# Patient Record
Sex: Female | Born: 1980 | Race: Black or African American | Hispanic: No | Marital: Single | Smoking: Never smoker
Health system: Southern US, Community
[De-identification: ages and names within clinical notes are randomized; demographics above are authoritative.]

## PROBLEM LIST (undated history)

## (undated) DIAGNOSIS — R011 Cardiac murmur, unspecified: Secondary | ICD-10-CM

## (undated) DIAGNOSIS — K219 Gastro-esophageal reflux disease without esophagitis: Secondary | ICD-10-CM

## (undated) DIAGNOSIS — K921 Melena: Secondary | ICD-10-CM

## (undated) DIAGNOSIS — Z8619 Personal history of other infectious and parasitic diseases: Secondary | ICD-10-CM

## (undated) HISTORY — DX: Melena: K92.1

## (undated) HISTORY — DX: Cardiac murmur, unspecified: R01.1

## (undated) HISTORY — DX: Gastro-esophageal reflux disease without esophagitis: K21.9

## (undated) HISTORY — DX: Personal history of other infectious and parasitic diseases: Z86.19

---

## 2013-10-18 ENCOUNTER — Ambulatory Visit: Payer: BC Managed Care – PPO | Admitting: Family Medicine

## 2013-10-18 ENCOUNTER — Ambulatory Visit: Payer: BC Managed Care – PPO

## 2013-10-18 VITALS — BP 118/64 | HR 79 | Temp 98.9°F | Resp 18 | Ht 67.0 in | Wt 157.0 lb

## 2013-10-18 DIAGNOSIS — K219 Gastro-esophageal reflux disease without esophagitis: Secondary | ICD-10-CM

## 2013-10-18 DIAGNOSIS — R079 Chest pain, unspecified: Secondary | ICD-10-CM

## 2013-10-18 LAB — POCT CBC
Granulocyte percent: 47.8 %G (ref 37–80)
HCT, POC: 43.3 % (ref 37.7–47.9)
Hemoglobin: 13.6 g/dL (ref 12.2–16.2)
Lymph, poc: 1.9 (ref 0.6–3.4)
MCH, POC: 28.4 pg (ref 27–31.2)
MCHC: 31.4 g/dL — AB (ref 31.8–35.4)
MCV: 90.4 fL (ref 80–97)
MID (cbc): 0.4 (ref 0–0.9)
MPV: 8.5 fL (ref 0–99.8)
POC Granulocyte: 2.2 (ref 2–6.9)
POC LYMPH PERCENT: 42.7 %L (ref 10–50)
POC MID %: 9.5 % (ref 0–12)
Platelet Count, POC: 274 10*3/uL (ref 142–424)
RBC: 4.79 M/uL (ref 4.04–5.48)
RDW, POC: 13.2 %
WBC: 4.5 10*3/uL — AB (ref 4.6–10.2)

## 2013-10-18 LAB — COMPLETE METABOLIC PANEL WITH GFR
ALT: 12 U/L (ref 0–35)
AST: 18 U/L (ref 0–37)
Albumin: 4.7 g/dL (ref 3.5–5.2)
Alkaline Phosphatase: 56 U/L (ref 39–117)
BUN: 11 mg/dL (ref 6–23)
CO2: 26 mEq/L (ref 19–32)
Calcium: 10.5 mg/dL (ref 8.4–10.5)
Chloride: 103 mEq/L (ref 96–112)
Creat: 0.81 mg/dL (ref 0.50–1.10)
GFR, Est African American: >89 mL/min
GFR, Est Non African American: >89 mL/min
Glucose, Bld: 94 mg/dL (ref 70–99)
Potassium: 4.8 mEq/L (ref 3.5–5.3)
Sodium: 137 mEq/L (ref 135–145)
Total Bilirubin: 0.6 mg/dL (ref 0.3–1.2)
Total Protein: 7.6 g/dL (ref 6.0–8.3)

## 2013-10-18 NOTE — Progress Notes (Signed)
Chief Complaint:  Chief Complaint  Patient presents with  . Chest Pain    x 1.5 weeks ; worse at night , hurts to breath in     HPI: Tanya Ayala is a 33 y.o. female who is here for mid substernum CP and radiates to back for the last 1.5 weeks, NKI, no triggers, last night she hurts it the most. She does have a history of heartburn. , When it comes on it will last all day, throbbing and dull, contracting pain, 8/10 lasts for 1-2 days. 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 Deneis any heart diseasein family but mom had a pacemaker put in for SSS. Deneis HTN, hyperlipidemia, diabetes. HAs been eating fastfood and TV dinners.Nonsmoker, No unintentional weightloss.  Like to drink orange juice  History reviewed. No pertinent past medical history. History reviewed. No pertinent past surgical history. History   Social History  . Marital Status: Single    Spouse Name: N/A    Number of Children: N/A  . Years of Education: N/A   Social History Main Topics  . Smoking status: Never Smoker   . Smokeless tobacco: None  . Alcohol Use: Yes  . Drug Use: No  . Sexual Activity: None   Other Topics Concern  . None   Social History Narrative  . None   Family History  Problem Relation Age of Onset  . Hyperlipidemia Mother   . Cancer Father   . Stroke Maternal Grandmother    No Known Allergies Prior to Admission medications   Medication Sig Start Date End Date Taking? Authorizing Provider  norethindrone-ethinyl estradiol (JUNEL FE,GILDESS FE,LOESTRIN FE) 1-20 MG-MCG tablet Take 1 tablet by mouth daily.   Yes Historical Provider, MD     ROS: The patient denies fevers, chills, night sweats, unintentional weight loss,  wheezing, dyspnea on exertion, nausea, vomiting, abdominal pain, dysuria, hematuria, melena, numbness, weakness, or tingling.  All other systems have been reviewed and were  otherwise negative with the exception of those mentioned in the HPI and as above.    PHYSICAL EXAM: Filed Vitals:   10/18/13 0818  BP: 118/64  Pulse: 79  Temp: 98.9 F (37.2 C)  Resp: 18   Filed Vitals:   10/18/13 0818  Height: 5\' 7"  (1.702 m)  Weight: 157 lb (71.215 kg)   Body mass index is 24.58 kg/(m^2).  General: Alert, no acute distress HEENT:  Normocephalic, atraumatic, oropharynx patent. EOMI, PERRLA Cardiovascular:  Regular rate and rhythm, no rubs murmurs or gallops.  No Carotid bruits, radial pulse intact. No pedal edema.  Respiratory: Clear to auscultation bilaterally.  No wheezes, rales, or rhonchi.  No cyanosis, no use of accessory musculature GI: No organomegaly, abdomen is soft and non-tender, positive bowel sounds.  No masses. Skin: No rashes. Neurologic: Facial musculature symmetric. Psychiatric: Patient is appropriate throughout our interaction. Lymphatic: No cervical lymphadenopathy Musculoskeletal: Gait intact.   LABS: Results for orders placed in visit on 10/18/13  POCT CBC      Result Value Range   WBC 4.5 (*) 4.6 - 10.2 K/uL   Lymph, poc 1.9  0.6 - 3.4   POC LYMPH PERCENT 42.7  10 - 50 %L   MID (cbc) 0.4  0 - 0.9   POC MID % 9.5  0 - 12 %M   POC Granulocyte 2.2  2 - 6.9   Granulocyte percent 47.8  37 -  80 %G   RBC 4.79  4.04 - 5.48 M/uL   Hemoglobin 13.6  12.2 - 16.2 g/dL   HCT, POC 40.943.3  81.137.7 - 47.9 %   MCV 90.4  80 - 97 fL   MCH, POC 28.4  27 - 31.2 pg   MCHC 31.4 (*) 31.8 - 35.4 g/dL   RDW, POC 91.413.2     Platelet Count, POC 274  142 - 424 K/uL   MPV 8.5  0 - 99.8 fL     EKG/XRAY:   [facility]. No acute cardiopulmonary process EKG SR 66 bpm nonspecific ST changes, no depression or elevation   ASSESSMENT/PLAN: Encounter Diagnoses  Name Primary?  . Chest pain   . GERD (gastroesophageal reflux disease) Yes   EKG, Chest Xray and labs in office are normal Will await for CMP She was given nexium  sampels, she was given GI cocktail  F/u prn or ER for worsening sxs GERD precautions given  Gross sideeffects, risk and benefits, and alternatives of medications d/w patient. Patient is aware that all medications have potential sideeffects and we are unable to predict every sideeffect or drug-drug interaction that may occur.  Lizzeth Meder PHUONG, DO 10/18/2013 9:48 AM

## 2013-10-18 NOTE — Patient Instructions (Signed)

## 2013-11-13 ENCOUNTER — Ambulatory Visit: Payer: BC Managed Care – PPO | Admitting: Emergency Medicine

## 2013-11-13 VITALS — BP 100/68 | HR 95 | Temp 98.3°F | Resp 18 | Ht 66.0 in | Wt 153.0 lb

## 2013-11-13 DIAGNOSIS — K219 Gastro-esophageal reflux disease without esophagitis: Secondary | ICD-10-CM

## 2013-11-13 MED ORDER — ESOMEPRAZOLE MAGNESIUM 40 MG PO CPDR: 40.0000 mg | DELAYED_RELEASE_CAPSULE | Freq: Every day | ORAL | Status: DC

## 2013-11-13 MED ORDER — SUCRALFATE 1 G PO TABS: ORAL_TABLET | ORAL | Status: DC

## 2013-11-13 NOTE — Progress Notes (Signed)
[facility] [address], [place] [address] [phone]  Date:  11/13/2013   Name:  Tanya Ayala   DOB:  September 25, 1980   MRN:  [id number]  PCP:  No PCP Per Patient    Chief Complaint: Follow-up and clogged rt ear x1 week   History of Present Illness:  Tanya Ayala is a 33 y.o. very pleasant female patient who presents with the following:  Seen with GERD and has not improved with medications.  Frequent sodas, occasional alcohol and daily caffeine.  Long history of waterbrash and heartburn.  Never really evaluated.  Non smoker.  No improvement with over the counter medications or other home remedies. Denies other complaint or health concern today.   There are no active problems to display for this patient.   History reviewed. No pertinent past medical history.  History reviewed. No pertinent past surgical history.  History  Substance Use Topics  . Smoking status: Never Smoker   . Smokeless tobacco: Not on file  . Alcohol Use: Yes    Family History  Problem Relation Age of Onset  . Hyperlipidemia Mother   . Cancer Father   . Stroke Maternal Grandmother     No Known Allergies  Medication list has been reviewed and updated.  Current Outpatient Prescriptions on File Prior to Visit  Medication Sig Dispense Refill  . norethindrone-ethinyl estradiol (JUNEL FE,GILDESS FE,LOESTRIN FE) 1-20 MG-MCG tablet Take 1 tablet by mouth daily.       No current facility-administered medications on file prior to visit.    Review of Systems:  As per HPI, otherwise negative.    Physical Examination: Filed Vitals:   11/13/13 1300  BP: 100/68  Pulse: 95  Temp: 98.3 F (36.8 C)  Resp: 18   Filed Vitals:   11/13/13 1300  Height: 5\' 6"  (1.676 m)  Weight: 153 lb (69.4 kg)   Body mass index is 24.71 kg/(m^2). Ideal Body Weight: Weight in (lb) to have BMI = 25: 154.6  GEN: WDWN, NAD, Non-toxic, A & O x 3 HEENT: Atraumatic, Normocephalic.  Neck supple. No masses, No LAD. Ears and Nose: No external deformity. CV: RRR, No M/G/R. No JVD. No thrill. No extra heart sounds. PULM: CTA B, no wheezes, crackles, rhonchi. No retractions. No resp. distress. No accessory muscle use. ABD: S, NT, ND, +BS. No rebound. No HSM. EXTR: No c/c/e NEURO Normal gait.  PSYCH: Normally interactive. Conversant. Not depressed or anxious appearing.  Calm demeanor.    Assessment and Plan: GERD nexium  carafate Avoid evening eating and drinking after supper Follow up in a month   Signed,  Phillips OdorJeffery Anderson, MD

## 2013-11-13 NOTE — Patient Instructions (Signed)
Gastroesophageal Reflux Disease, Adult  Gastroesophageal reflux disease (GERD) happens when acid from your stomach flows up into the esophagus. When acid comes in contact with the esophagus, the acid causes soreness (inflammation) in the esophagus. Over time, GERD may create small holes (ulcers) in the lining of the esophagus.  CAUSES   · Increased body weight. This puts pressure on the stomach, making acid rise from the stomach into the esophagus.  · Smoking. This increases acid production in the stomach.  · Drinking alcohol. This causes decreased pressure in the lower esophageal sphincter (valve or ring of muscle between the esophagus and stomach), allowing acid from the stomach into the esophagus.  · Late evening meals and a full stomach. This increases pressure and acid production in the stomach.  · A malformed lower esophageal sphincter.  Sometimes, no cause is found.  SYMPTOMS   · Burning pain in the lower part of the mid-chest behind the breastbone and in the mid-stomach area. This may occur twice a week or more often.  · Trouble swallowing.  · Sore throat.  · Dry cough.  · Asthma-like symptoms including chest tightness, shortness of breath, or wheezing.  DIAGNOSIS   Your caregiver may be able to diagnose GERD based on your symptoms. In some cases, X-rays and other tests may be done to check for complications or to check the condition of your stomach and esophagus.  TREATMENT   Your caregiver may recommend over-the-counter or prescription medicines to help decrease acid production. Ask your caregiver before starting or adding any new medicines.   HOME CARE INSTRUCTIONS   · Change the factors that you can control. Ask your caregiver for guidance concerning weight loss, quitting smoking, and alcohol consumption.  · Avoid foods and drinks that make your symptoms worse, such as:  · Caffeine or alcoholic drinks.  · Chocolate.  · Peppermint or mint flavorings.  · Garlic and onions.  · Spicy foods.  · Citrus fruits,  such as oranges, lemons, or limes.  · Tomato-based foods such as sauce, chili, salsa, and pizza.  · Fried and fatty foods.  · Avoid lying down for the 3 hours prior to your bedtime or prior to taking a nap.  · Eat small, frequent meals instead of large meals.  · Wear loose-fitting clothing. Do not wear anything tight around your waist that causes pressure on your stomach.  · Raise the head of your bed 6 to 8 inches with wood blocks to help you sleep. Extra pillows will not help.  · Only take over-the-counter or prescription medicines for pain, discomfort, or fever as directed by your caregiver.  · Do not take aspirin, ibuprofen, or other nonsteroidal anti-inflammatory drugs (NSAIDs).  SEEK IMMEDIATE MEDICAL CARE IF:   · You have pain in your arms, neck, jaw, teeth, or back.  · Your pain increases or changes in intensity or duration.  · You develop nausea, vomiting, or sweating (diaphoresis).  · You develop shortness of breath, or you faint.  · Your vomit is green, yellow, black, or looks like coffee grounds or blood.  · Your stool is red, bloody, or black.  These symptoms could be signs of other problems, such as heart disease, gastric bleeding, or esophageal bleeding.  MAKE SURE YOU:   · Understand these instructions.  · Will watch your condition.  · Will get help right away if you are not doing well or get worse.  Document Released: 06/19/2005 Document Revised: 12/02/2011 Document Reviewed: 03/29/2011  ExitCare® Patient   Information ©2014 [employer].  Diet for Gastroesophageal Reflux Disease, Adult  Reflux (acid reflux) is when acid from your stomach flows up into the esophagus. When acid comes in contact with the esophagus, the acid causes irritation and soreness (inflammation) in the esophagus. When reflux happens often or so severely that it causes damage to the esophagus, it is called gastroesophageal reflux disease (GERD). Nutrition therapy can help ease the discomfort of GERD.  FOODS OR DRINKS TO AVOID OR  LIMIT  · Smoking or chewing tobacco. Nicotine is one of the most potent stimulants to acid production in the gastrointestinal tract.  · Caffeinated and decaffeinated coffee and black tea.  · Regular or low-calorie carbonated beverages or energy drinks (caffeine-free carbonated beverages are allowed).    · Strong spices, such as black pepper, white pepper, red pepper, cayenne, curry powder, and chili powder.  · Peppermint or spearmint.  · Chocolate.  · High-fat foods, including meats and fried foods. Extra added fats including oils, butter, salad dressings, and nuts. Limit these to less than 8 tsp per day.  · Fruits and vegetables if they are not tolerated, such as citrus fruits or tomatoes.  · Alcohol.  · Any food that seems to aggravate your condition.  If you have questions regarding your diet, call your caregiver or a registered dietitian.  OTHER THINGS THAT MAY HELP GERD INCLUDE:   · Eating your meals slowly, in a relaxed setting.  · Eating 5 to 6 small meals per day instead of 3 large meals.  · Eliminating food for a period of time if it causes distress.  · Not lying down until 3 hours after eating a meal.  · Keeping the head of your bed raised 6 to 9 inches (15 to 23 cm) by using a foam wedge or blocks under the legs of the bed. Lying flat may make symptoms worse.  · Being physically active. Weight loss may be helpful in reducing reflux in overweight or obese adults.  · Wear loose fitting clothing  EXAMPLE MEAL PLAN  This meal plan is approximately 2,000 calories based on ChooseMyPlate.gov meal planning guidelines.  Breakfast  · ½ cup cooked oatmeal.  · 1 cup strawberries.  · 1 cup low-fat milk.  · 1 oz almonds.  Snack  · 1 cup cucumber slices.  · 6 oz yogurt (made from low-fat or fat-free milk).  Lunch  · 2 slice whole-wheat bread.  · 2½ oz sliced turkey.  · 2 tsp mayonnaise.  · 1 cup blueberries.  · 1 cup snap peas.  Snack  · 6 whole-wheat crackers.  · 1 oz string cheese.  Dinner  · ½ cup brown rice.  · 1  cup mixed veggies.  · 1 tsp olive oil.  · 3 oz grilled fish.  Document Released: 09/09/2005 Document Revised: 12/02/2011 Document Reviewed: 07/26/2011  ExitCare® Patient Information ©2014 [employer].

## 2013-11-15 LAB — H. PYLORI ANTIBODY, IGG: H Pylori IgG: 0.49 {ISR}

## 2014-12-13 ENCOUNTER — Encounter: Payer: Self-pay | Admitting: Family Medicine

## 2014-12-13 ENCOUNTER — Ambulatory Visit (INDEPENDENT_AMBULATORY_CARE_PROVIDER_SITE_OTHER): Payer: BLUE CROSS/BLUE SHIELD | Admitting: Family Medicine

## 2014-12-13 VITALS — BP 108/82 | HR 90 | Temp 98.9°F | Ht 65.5 in | Wt 150.7 lb

## 2014-12-13 DIAGNOSIS — Z23 Encounter for immunization: Secondary | ICD-10-CM | POA: Diagnosis not present

## 2014-12-13 DIAGNOSIS — K219 Gastro-esophageal reflux disease without esophagitis: Secondary | ICD-10-CM

## 2014-12-13 DIAGNOSIS — N946 Dysmenorrhea, unspecified: Secondary | ICD-10-CM

## 2014-12-13 DIAGNOSIS — Z7689 Persons encountering health services in other specified circumstances: Secondary | ICD-10-CM

## 2014-12-13 DIAGNOSIS — Z7189 Other specified counseling: Secondary | ICD-10-CM | POA: Diagnosis not present

## 2014-12-13 MED ORDER — NORETHIN ACE-ETH ESTRAD-FE 1.5-30 MG-MCG PO TABS: 1.0000 | ORAL_TABLET | Freq: Every day | ORAL | Status: DC

## 2014-12-13 NOTE — Progress Notes (Signed)
Pre visit review using our clinic review tool, if applicable. No additional management support is needed unless otherwise documented below in the visit note. 

## 2014-12-13 NOTE — Progress Notes (Signed)
HPI:  Tanya Ayala is here to establish care. Referred by two of her friends. Last PCP and physical: had a pap last year and this was normal. Reports all normal paps the last 8 years.  Has the following chronic problems that require follow up and concerns today:  Contraception: -takes microgestin -she reports gets HAs and nausea with her periods, no aura -periods are regular -getting married next august, sexually active -not concerned fo STI  GERD: -doing ok as long as watches her diet -on PPI in the past -denies: vomiting, dysphagia, weight loss  ROS negative for unless reported above: fevers, unintentional weight loss, hearing or vision loss, chest pain, palpitations, struggling to breath, hemoptysis, melena, hematochezia, hematuria, falls, loc, si, thoughts of self harm  Past Medical History  Diagnosis Date  . GERD (gastroesophageal reflux disease)   . History of chicken pox     History reviewed. No pertinent past surgical history.  Family History  Problem Relation Age of Onset  . Hyperlipidemia Mother   . Cancer Father     lung-deceased  . Stroke Maternal Grandmother     History   Social History  . Marital Status: Single    Spouse Name: N/A  . Number of Children: N/A  . Years of Education: N/A   Social History Main Topics  . Smoking status: Never Smoker   . Smokeless tobacco: Not on file  . Alcohol Use: Yes     Comment: occ rare alcohol  . Drug Use: No  . Sexual Activity: Not on file   Other Topics Concern  . 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  norethindrone-ethinyl estradiol-iron (MICROGESTIN FE,GILDESS FE,LOESTRIN FE) 1.5-30 MG-MCG tablet, Take 1 tablet by mouth daily., Disp: 1 Package, Rfl:  11  EXAM:  Filed Vitals:   12/13/14 0929  BP: 108/82  Pulse: 90  Temp: 98.9 F (37.2 C)    Body mass index is 24.69 kg/(m^2).  GENERAL: vitals reviewed and listed above, alert, oriented, appears well hydrated and in no acute distress  HEENT: atraumatic, conjunttiva clear, no obvious abnormalities on inspection of external nose and ears  NECK: no obvious masses on inspection  LUNGS: clear to auscultation bilaterally, no wheezes, rales or rhonchi, good air movement  CV: HRRR, no peripheral edema  MS: moves all extremities without noticeable abnormality  PSYCH: pleasant and cooperative, no obvious depression or anxiety  ASSESSMENT AND PLAN:  Discussed the following assessment and plan:  Dysmenorrhea -trial naproxen  Gastroesophageal reflux disease, esophagitis presence not specified -cont lifestyle changes, tums or zantac prn  Encounter to establish care -We reviewed the PMH, PSH, FH, SH, Meds and Allergies. -We provided refills for any medications we will prescribe as needed. -We addressed current concerns per orders and patient instructions. -We have asked for records for pertinent exams, studies, vaccines and notes from previous providers. -We have advised patient to follow up per instructions below.   -Patient advised to return or notify a doctor immediately if symptoms worsen or persist or new concerns arise.  Patient Instructions  BEFORE YOU: -schedule physical in 3-6 months  Try Naproxen (aleve) twice daily for 2 days before  and during period  We recommend the following healthy lifestyle measures: - eat a healthy diet consisting of lots of vegetables, fruits, beans, nuts, seeds, healthy meats such as white chicken and fish and whole grains.  - avoid fried foods, fast food, processed foods, sodas, red meet and other fattening foods.  - get a least 150 minutes of aerobic exercise per week.       Tanya BasqueKIM, Tanya Ayala R.

## 2014-12-13 NOTE — Addendum Note (Signed)
Addended by: Johnella MoloneyFUNDERBURK, JO A on: 12/13/2014 10:26 AM   Modules accepted: Orders

## 2014-12-13 NOTE — Patient Instructions (Signed)
BEFORE YOU: -schedule physical in 3-6 months  Try Naproxen (aleve) twice daily for 2 days before and during period  We recommend the following healthy lifestyle measures: - eat a healthy diet consisting of lots of vegetables, fruits, beans, nuts, seeds, healthy meats such as white chicken and fish and whole grains.  - avoid fried foods, fast food, processed foods, sodas, red meet and other fattening foods.  - get a least 150 minutes of aerobic exercise per week.

## 2015-02-27 IMAGING — CR DG CHEST 2V
2 series · 2 of 2 positions shown · non-contrast
Comparison: None.

CLINICAL DATA: Chest pain.

EXAM:
CHEST  2 VIEW

[PA]
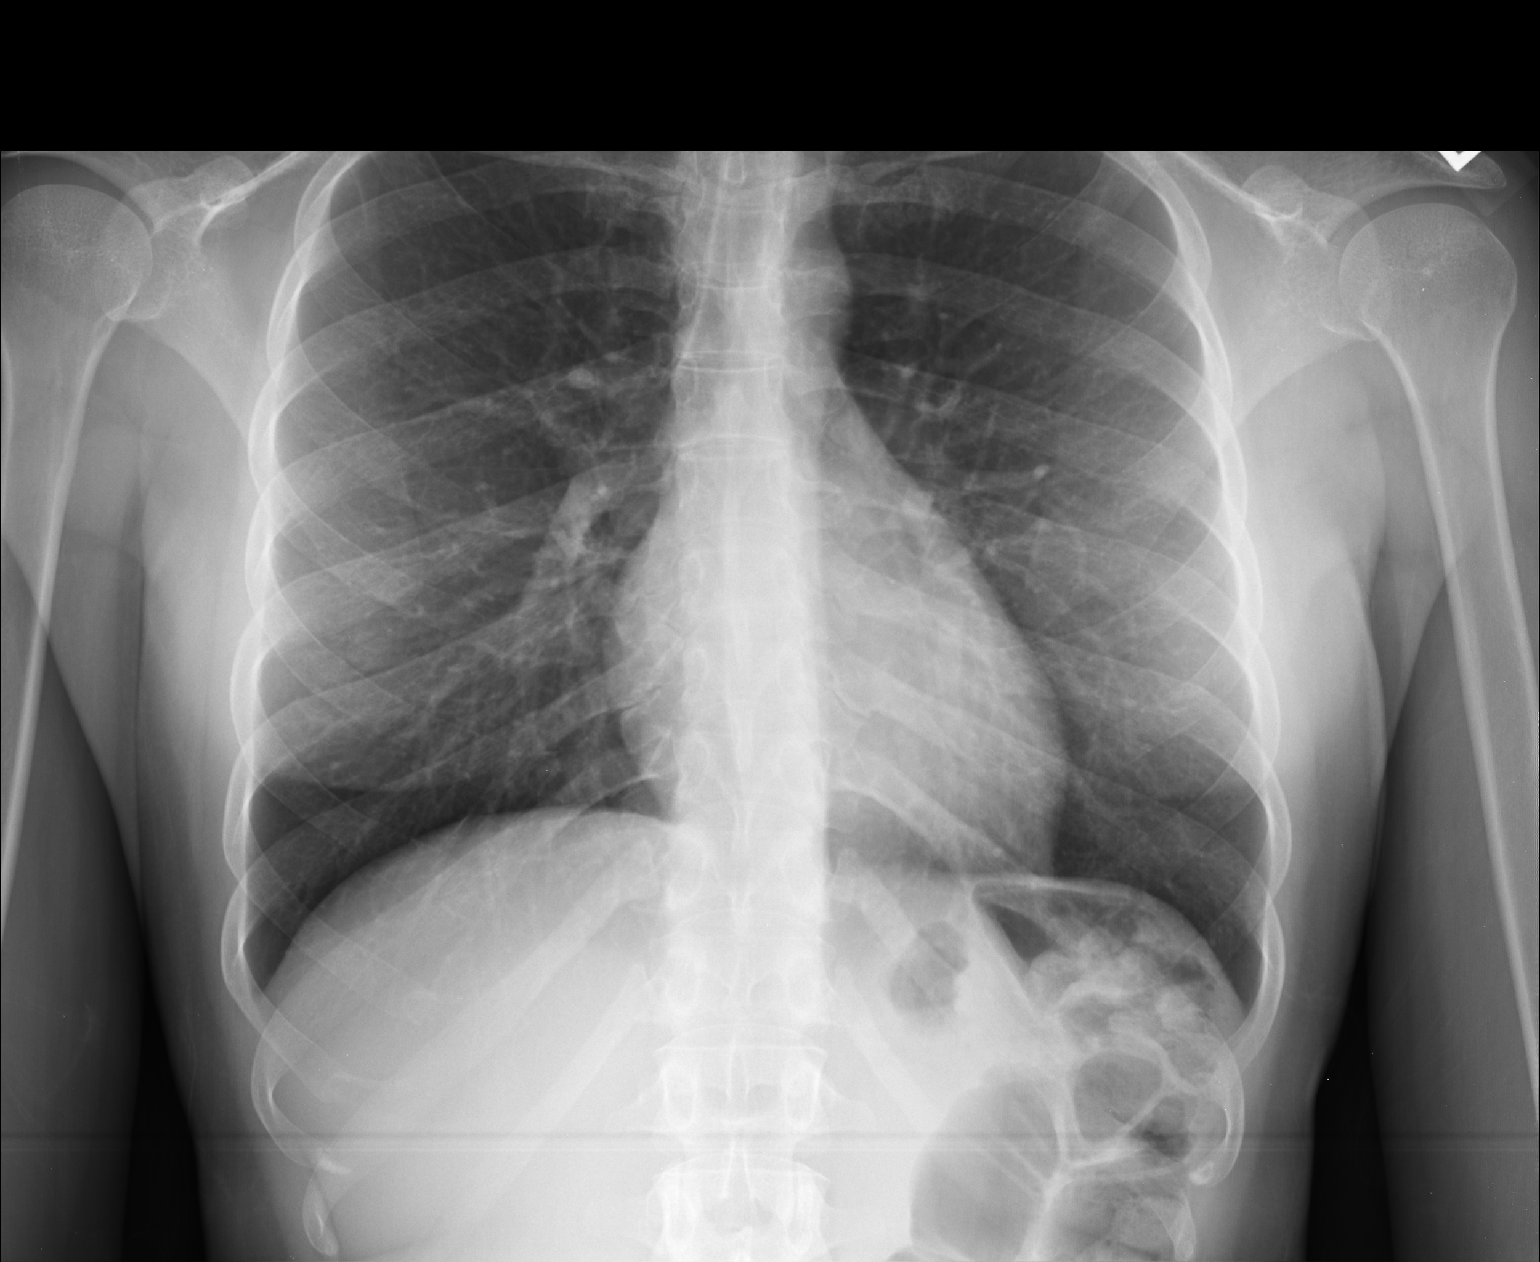

[lateral]
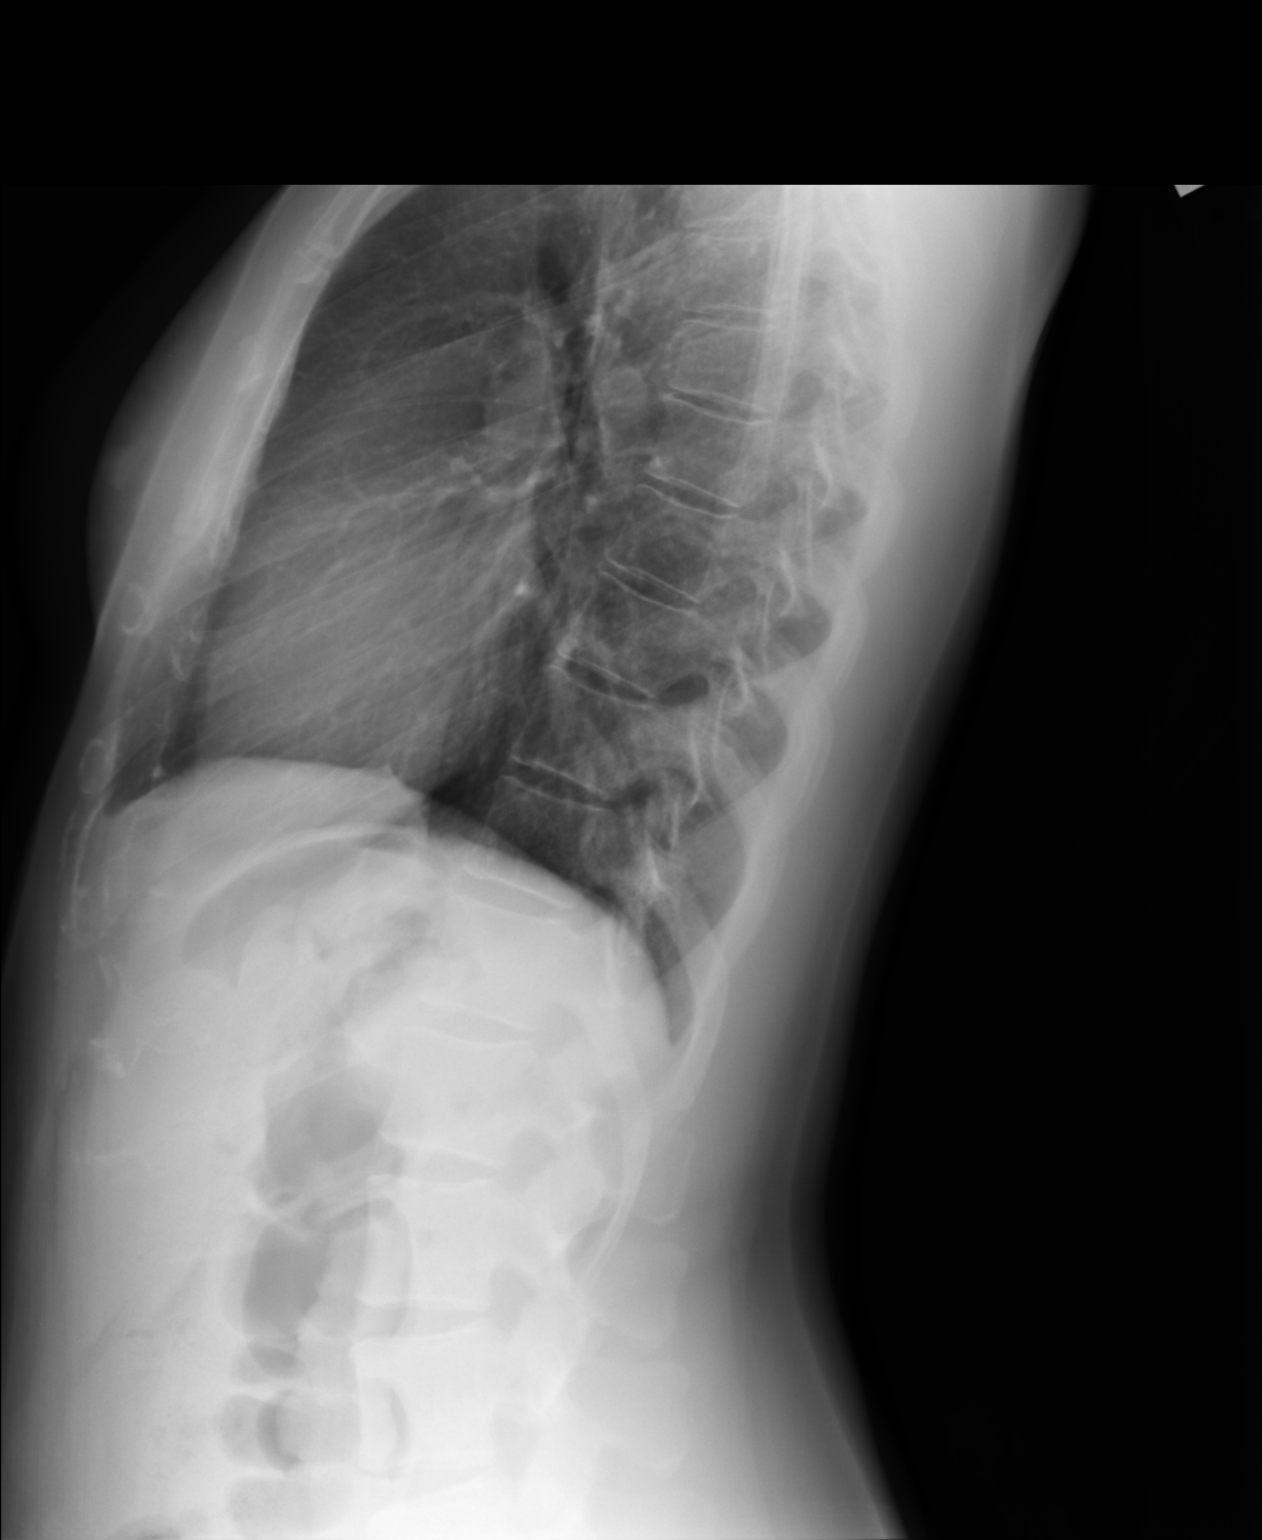

[2 of 2 positions shown; findings below may reference images not displayed]

FINDINGS: The heart size and mediastinal contours are within normal limits.
Both lungs are clear. No pleural effusion or pneumothorax is noted.
The visualized skeletal structures are unremarkable.
IMPRESSION: No active cardiopulmonary disease.

## 2015-03-23 ENCOUNTER — Encounter: Payer: Self-pay | Admitting: Family Medicine

## 2015-03-23 ENCOUNTER — Ambulatory Visit (INDEPENDENT_AMBULATORY_CARE_PROVIDER_SITE_OTHER): Payer: BLUE CROSS/BLUE SHIELD | Admitting: Family Medicine

## 2015-03-23 VITALS — BP 98/62 | HR 87 | Temp 98.5°F | Ht 65.0 in | Wt 149.0 lb

## 2015-03-23 DIAGNOSIS — Z0000 Encounter for general adult medical examination without abnormal findings: Secondary | ICD-10-CM | POA: Diagnosis not present

## 2015-03-23 DIAGNOSIS — Z3049 Encounter for surveillance of other contraceptives: Secondary | ICD-10-CM

## 2015-03-23 DIAGNOSIS — Z3200 Encounter for pregnancy test, result unknown: Secondary | ICD-10-CM | POA: Diagnosis not present

## 2015-03-23 LAB — LIPID PANEL
CHOLESTEROL: 163 mg/dL (ref 0–200)
HDL: 52.9 mg/dL (ref >39.00)
LDL Cholesterol: 96 mg/dL (ref 0–99)
NonHDL: 110.1
TRIGLYCERIDES: 71 mg/dL (ref 0.0–149.0)
Total CHOL/HDL Ratio: 3
VLDL: 14.2 mg/dL (ref 0.0–40.0)

## 2015-03-23 LAB — POCT URINE PREGNANCY: PREG TEST UR: NEGATIVE

## 2015-03-23 LAB — HEMOGLOBIN A1C: HEMOGLOBIN A1C: 5.5 % (ref 4.6–6.5)

## 2015-03-23 MED ORDER — ETONOGESTREL-ETHINYL ESTRADIOL 0.12-0.015 MG/24HR VA RING: VAGINAL_RING | VAGINAL | Status: AC

## 2015-03-23 NOTE — Patient Instructions (Signed)
BEFORE YOU LEAVE: -labs -obtain prior pap results -follow up yearly and as needed  We recommend the following healthy lifestyle measures: - eat a healthy diet consisting of lots of vegetables, fruits, beans, nuts, seeds, healthy meats such as white chicken and fish and whole grains.  - avoid fried foods, fast food, processed foods, sodas, red meet and other fattening foods.  - get a least 150 minutes of aerobic exercise per week.   -We have ordered labs or studies at this visit. It can take up to 1-2 weeks for results and processing. We will contact you with instructions IF your results are abnormal. Normal results will be released to your [facility]. If you have not heard from us or can not find your results in [facility] in 2 weeks please contact our office.

## 2015-03-23 NOTE — Progress Notes (Signed)
HPI:  Here for CPE:  -Concerns and/or follow up today:   Contraception: -she wants to try something different -got headaches and nausea with periods with all pills -denies HTN, hx blood clots -did not try nsaids -FDLMP: June 26th, 2016, regular normal  -Diet: variety of foods, balance and well rounded, larger portion sizes  -Exercise: no regular exercise  -Taking folic acid, vitamin D or calcium: MV  -Diabetes and Dyslipidemia Screening: not FASTING  -Hx of HTN: no  -Vaccines: UTD  -pap history: 03/2014, reports normal, reports all normal paps in the last 10 years, [facility], [facility]  -sexual activity: yes, female partner, no new partners  -wants STI testing (Hep C if born [birth year]): no  -FH breast, colon or ovarian ca: see FH Last mammogram: n/a Last colon cancer screening: n/a  -Alcohol, Tobacco, drug use: see social history  Review of Systems - no fevers, unintentional weight loss, vision loss, hearing loss, chest pain, sob, hemoptysis, melena, hematochezia, hematuria, genital discharge, changing or concerning skin lesions, bleeding, bruising, loc, thoughts of self harm or SI  Past Medical History  Diagnosis Date  . GERD (gastroesophageal reflux disease)   . History of chicken pox   . Blood in stool   . Heart murmur     No past surgical history on file.  Family History  Problem Relation Age of Onset  . Hyperlipidemia Mother   . Cancer Father     lung-deceased  . Stroke Maternal Grandmother     History   Social History  . Marital Status: Single    Spouse Name: N/A  . Number of Children: N/A  . Years of Education: N/A   Social History Main Topics  . Smoking status: Never Smoker   . Smokeless tobacco: Not on file  . Alcohol Use: Yes     Comment: occ rare alcohol  . Drug Use: No  . Sexual Activity: Not on file   Other Topics Concern  . 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  etonogestrel-ethinyl estradiol (NUVARING) 0.12-0.015 MG/24HR vaginal ring, Insert vaginally and leave in place for 3 consecutive weeks, then remove for 1 week., Disp: 3 each, Rfl: 3  EXAM:  Filed Vitals:   03/23/15 0758  BP: 98/62  Pulse: 87  Temp: 98.5 F (36.9 C)    GENERAL: vitals reviewed and listed below, alert, oriented, appears well hydrated and in no acute distress  HEENT: head atraumatic, PERRLA, normal appearance of eyes, ears, nose and mouth. moist mucus membranes.  NECK: supple, no masses or lymphadenopathy  LUNGS: clear to auscultation bilaterally, no rales, rhonchi or wheeze  CV: HRRR, no peripheral edema or cyanosis, normal pedal pulses  BREAST: declined  ABDOMEN: bowel sounds normal, soft, non tender to palpation, no masses, no rebound or guarding  GU: declined  RECTAL: refused  SKIN: no rash or abnormal lesions  MS: normal gait, moves all extremities normally  NEURO: CN II-XII grossly intact, normal muscle strength and sensation to light touch on extremities  PSYCH: normal affect, pleasant and cooperative  ASSESSMENT AND PLAN:  Discussed the following assessment and plan:  Visit for preventive health examination - Plan: Lipid Panel, Hemoglobin A1c, etonogestrel-ethinyl estradiol (NUVARING) 0.12-0.015 MG/24HR vaginal ring  Pregnancy examination or test, pregnancy unconfirmed - Plan: POCT urine pregnancy, etonogestrel-ethinyl estradiol (NUVARING) 0.12-0.015 MG/24HR vaginal ring  Encounter for surveillance of other contraceptive  -Discussed and advised all [place] preventive services health task force level A and B recommendations for age, sex and risks.  -Advised at least 150 minutes of exercise per week and a healthy diet low in  saturated fats and sweets and consisting of fresh fruits and vegetables, lean meats such as fish and white chicken and whole grains.  -will advise assistant to obtain pap done in the last 1 year  -discussed options for birth control - she wants to try the nuva ring, discussed risks, neg preg test today, may consider paragard if still has side effects  -NON-FASTING labs, studies and vaccines per orders this encounter  Orders Placed This Encounter  Procedures  . Lipid Panel  . Hemoglobin A1c  . POCT urine pregnancy    Patient advised to return to clinic immediately if symptoms worsen or persist or new concerns.  Patient Instructions  BEFORE YOU LEAVE: -labs -obtain prior pap results -follow up yearly and as needed  We recommend the following healthy lifestyle measures: - eat a healthy diet consisting of lots of vegetables, fruits, beans, nuts, seeds, healthy meats such as white chicken and fish and whole grains.  - avoid fried foods, fast food, processed foods, sodas, red meet and other fattening foods.  - get a least 150 minutes of aerobic exercise per week.   -We have ordered labs or studies at this visit. It can take up to 1-2 weeks for results and processing. We will contact you with instructions IF your results are abnormal. Normal results will be released to [facility]. If you have not heard from [place] or can not find your results in [facility] in 2 weeks please contact our office.           No Follow-up on file.  [name], HANNAH R.

## 2015-03-23 NOTE — Progress Notes (Signed)
Pre visit review using our clinic review tool, if applicable. No additional management support is needed unless otherwise documented below in the visit note. 

## 2015-04-14 ENCOUNTER — Telehealth: Payer: Self-pay | Admitting: *Deleted

## 2015-04-14 NOTE — Telephone Encounter (Signed)
Patient called wanting to know when to insert the Nuvaring.  I advised the pt per Dr Selena Batten she should insert this the Sunday after her period starts, change every 3 weeks and she agreed.

## 2015-05-04 ENCOUNTER — Ambulatory Visit (INDEPENDENT_AMBULATORY_CARE_PROVIDER_SITE_OTHER): Payer: BLUE CROSS/BLUE SHIELD | Admitting: Family Medicine

## 2015-05-04 ENCOUNTER — Encounter: Payer: Self-pay | Admitting: Family Medicine

## 2015-05-04 VITALS — BP 132/78 | HR 81 | Temp 99.1°F | Ht 65.0 in | Wt 148.0 lb

## 2015-05-04 DIAGNOSIS — N63 Unspecified lump in unspecified breast: Secondary | ICD-10-CM

## 2015-05-04 NOTE — Progress Notes (Signed)
  HPI:  ? Breast lump: -thought she fell a small lump in her L breast 1 week ago, she has ha trouble feeling it since then - can feel it sometime -denies discharge, skin changes, pain -FDLMP: July 24th -no FH breast or ovarian cancer  ROS: See pertinent positives and negatives per HPI.  Past Medical History  Diagnosis Date  . GERD (gastroesophageal reflux disease)   . History of chicken pox   . Blood in stool   . Heart murmur     No past surgical history on file.  Family History  Problem Relation Age of Onset  . Hyperlipidemia Mother   . Cancer Father     lung-deceased  . Stroke Maternal Grandmother     Social History   Social History  . Marital Status: Single    Spouse Name: N/A  . Number of Children: N/A  . Years of Education: N/A   Social History Main Topics  . Smoking status: Never Smoker   . Smokeless tobacco: None  . Alcohol Use: Yes     Comment: occ rare alcohol  . Drug Use: No  . Sexual Activity: Not Asked   Other Topics Concern  . 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  etonogestrel-ethinyl estradiol (NUVARING) 0.12-0.015 MG/24HR vaginal ring, Insert vaginally and leave in place for 3 consecutive weeks, then remove for 1 week., Disp: 3 each, Rfl: 3  EXAM:  Filed Vitals:   05/04/15 1614  BP: 132/78  Pulse: 81  Temp: 99.1 F (37.3 C)    Body mass index is 24.63 kg/(m^2).  GENERAL: vitals reviewed and listed above, alert, oriented, appears well hydrated and in no acute distress  HEENT: atraumatic, conjunttiva clear, no obvious abnormalities on inspection of external nose and ears  NECK: no obvious masses on inspection  BREAST: small lima bean size firm rubbery mobile mass in the L breast in near border or  [place] at 4 O'clock, some dense breast tissue bilaterally in the upper outer quadrants c/w fibrocystic changes, no skin changes or nipple dicharge  MS: moves all extremities without noticeable abnormality  PSYCH: pleasant and cooperative, no obvious depression or anxiety  ASSESSMENT AND PLAN:  Discussed the following assessment and plan:  Breast lump - Plan: MM Digital Screening Unilat L  -likely fibrocystic changes vs cyst -discussed potential etiologies and will have her do diag mammo -Patient advised to return or notify a doctor immediately if symptoms worsen or persist or new concerns arise.  Patient Instructions  -We placed a referral for you as discussed to the breast center. It usually takes about 1-2 weeks to process and schedule this referral. If you have not heard from [place] regarding this appointment in 2 weeks please contact our office.      Kriste Basque R.

## 2015-05-04 NOTE — Progress Notes (Signed)
Pre visit review using our clinic review tool, if applicable. No additional management support is needed unless otherwise documented below in the visit note. 

## 2015-05-04 NOTE — Patient Instructions (Signed)
-  We placed a referral for you as discussed to the breast center. It usually takes about 1-2 weeks to process and schedule this referral. If you have not heard from [place] regarding this appointment in 2 weeks please contact our office.

## 2015-05-15 ENCOUNTER — Other Ambulatory Visit: Payer: Self-pay | Admitting: Family Medicine

## 2015-05-15 DIAGNOSIS — N63 Unspecified lump in unspecified breast: Secondary | ICD-10-CM

## 2015-05-19 ENCOUNTER — Ambulatory Visit
Admission: RE | Admit: 2015-05-19 | Discharge: 2015-05-19 | Disposition: A | Discharge status: Home / Self Care | Payer: BLUE CROSS/BLUE SHIELD | Source: Ambulatory Visit | Attending: Family Medicine | Admitting: Family Medicine

## 2015-05-19 DIAGNOSIS — N63 Unspecified lump in unspecified breast: Secondary | ICD-10-CM
